# Patient Record
Sex: Male | Born: 1985 | Race: White | Hispanic: Yes | Marital: Single | Smoking: Current every day smoker
Health system: Southern US, Community
[De-identification: ages and names within clinical notes are randomized; demographics above are authoritative.]

## PROBLEM LIST (undated history)

## (undated) DIAGNOSIS — F419 Anxiety disorder, unspecified: Secondary | ICD-10-CM

---

## 2019-07-30 ENCOUNTER — Emergency Department: Payer: Self-pay

## 2019-07-30 ENCOUNTER — Other Ambulatory Visit: Payer: Self-pay

## 2019-07-30 ENCOUNTER — Emergency Department
Admission: EM | Admit: 2019-07-30 | Discharge: 2019-07-30 | Disposition: A | Discharge status: Home / Self Care | Payer: Self-pay | Attending: Emergency Medicine | Admitting: Emergency Medicine

## 2019-07-30 DIAGNOSIS — R0789 Other chest pain: Secondary | ICD-10-CM | POA: Insufficient documentation

## 2019-07-30 DIAGNOSIS — L03314 Cellulitis of groin: Secondary | ICD-10-CM

## 2019-07-30 DIAGNOSIS — L02214 Cutaneous abscess of groin: Secondary | ICD-10-CM | POA: Insufficient documentation

## 2019-07-30 LAB — COMPREHENSIVE METABOLIC PANEL
ALT: 57 U/L — ABNORMAL HIGH (ref 0–44)
AST: 27 U/L (ref 15–41)
Albumin: 4.3 g/dL (ref 3.5–5.0)
Alkaline Phosphatase: 55 U/L (ref 38–126)
Anion gap: 9 (ref 5–15)
BUN: 10 mg/dL (ref 6–20)
CO2: 24 mmol/L (ref 22–32)
Calcium: 9.2 mg/dL (ref 8.9–10.3)
Chloride: 105 mmol/L (ref 98–111)
Creatinine, Ser: 0.74 mg/dL (ref 0.61–1.24)
GFR calc Af Amer: >60 mL/min (ref >60)
GFR calc non Af Amer: >60 mL/min (ref >60)
Glucose, Bld: 109 mg/dL — ABNORMAL HIGH (ref 70–99)
Potassium: 4.8 mmol/L (ref 3.5–5.1)
Sodium: 138 mmol/L (ref 135–145)
Total Bilirubin: 1.3 mg/dL — ABNORMAL HIGH (ref 0.3–1.2)
Total Protein: 7.5 g/dL (ref 6.5–8.1)

## 2019-07-30 LAB — URINALYSIS, COMPLETE (UACMP) WITH MICROSCOPIC
Bacteria, UA: NONE SEEN
Bilirubin Urine: NEGATIVE
Glucose, UA: NEGATIVE mg/dL
Hgb urine dipstick: NEGATIVE
Ketones, ur: NEGATIVE mg/dL
Leukocytes,Ua: NEGATIVE
Nitrite: NEGATIVE
Protein, ur: NEGATIVE mg/dL
Specific Gravity, Urine: 1.005 (ref 1.005–1.030)
Squamous Epithelial / HPF: NONE SEEN (ref 0–5)
pH: 7 (ref 5.0–8.0)

## 2019-07-30 LAB — LIPASE, BLOOD: Lipase: 19 U/L (ref 11–51)

## 2019-07-30 LAB — CBC
HCT: 44.9 % (ref 39.0–52.0)
Hemoglobin: 15.5 g/dL (ref 13.0–17.0)
MCH: 31.1 pg (ref 26.0–34.0)
MCHC: 34.5 g/dL (ref 30.0–36.0)
MCV: 90.2 fL (ref 80.0–100.0)
Platelets: 282 10*3/uL (ref 150–400)
RBC: 4.98 MIL/uL (ref 4.22–5.81)
RDW: 12 % (ref 11.5–15.5)
WBC: 7 10*3/uL (ref 4.0–10.5)
nRBC: 0 % (ref 0.0–0.2)

## 2019-07-30 LAB — TROPONIN I (HIGH SENSITIVITY)
Troponin I (High Sensitivity): <2 ng/L (ref <18)
Troponin I (High Sensitivity): <2 ng/L (ref <18)

## 2019-07-30 MED ORDER — DICYCLOMINE HCL 10 MG PO CAPS: 10.0000 mg | ORAL_CAPSULE | Freq: Three times a day (TID) | ORAL | 0 refills | Status: AC | PRN

## 2019-07-30 MED ORDER — FAMOTIDINE 20 MG PO TABS
20.0000 mg | ORAL_TABLET | Freq: Two times a day (BID) | ORAL | 0 refills | Status: AC
Start: 2019-07-30 — End: 2019-08-14

## 2019-07-30 MED ORDER — MUPIROCIN CALCIUM 2 % EX CREA: TOPICAL_CREAM | CUTANEOUS | 0 refills | Status: AC

## 2019-07-30 MED ORDER — CEPHALEXIN 500 MG PO CAPS
500.0000 mg | ORAL_CAPSULE | Freq: Four times a day (QID) | ORAL | 0 refills | Status: AC
Start: 2019-07-30 — End: 2019-08-06

## 2019-07-30 NOTE — ED Notes (Signed)
Patient reports abd pain that began 1 week ago same thing happened last year. Presently c/o of and pain that radiates into jaw

## 2019-07-30 NOTE — ED Triage Notes (Signed)
Reports chest pain and abdominal pain, cramping and spasms. States he also had a panic attack at the same time, but those sx have subsided. Sx started today at 1215PM at rest.

## 2019-07-30 NOTE — ED Provider Notes (Signed)
[facility] Emergency Department Provider Note ____________________________________________   First MD Initiated Contact with Patient 07/30/19 1511     (approximate)  I have reviewed the triage vital signs and the nursing notes.   HISTORY  Chief Complaint Abdominal Pain and Chest Pain    HPI Danny Kramer is a 34 y.o. male with no active medical problems who presents with multiple complaints.  Specifically, he had an episode of crampy pain in his chest and upper abdomen, radiating up to the neck as well as to the left flank.  The pain lasted about an hour, and was accompanied by anxiety.  The patient states he had an anxiety attack, but the pain started before this.  He has had episodes of pain like this before, especially in the upper abdomen, but this 1 was more intense.  The patient reports a history of tonsil stones for several years, and states that he has intermittent cough sometimes with brownish sputum more small amount of blood especially when one of the stones passes and his tonsils are irritated.  He also reports pain in an area of the left groin where he has had a persistent skin lesion or possible abscess for at least a year.  History reviewed. No pertinent past medical history.  There are no problems to display for this patient.   History reviewed. No pertinent surgical history.  Prior to Admission medications   Medication Sig Start Date End Date Taking? Authorizing Provider  cephALEXin (KEFLEX) 500 MG capsule Take 1 capsule (500 mg total) by mouth 4 (four) times daily for 7 days. 07/30/19 08/06/19  Dionne Bucy, MD  dicyclomine (BENTYL) 10 MG capsule Take 1 capsule (10 mg total) by mouth 3 (three) times daily as needed for up to 5 days (abdominal cramping). 07/30/19 08/04/19  Dionne Bucy, MD  famotidine (PEPCID) 20 MG tablet Take 1 tablet (20 mg total) by mouth 2 (two) times daily for 15 days. 07/30/19 08/14/19  Dionne Bucy, MD   mupirocin cream Idelle Jo) 2 % Apply to affected area 3 times daily 07/30/19 07/29/20  Dionne Bucy, MD    Allergies Patient has no known allergies.  No family history on file.  Social History Social History   Tobacco Use  . Smoking status: Not on file  Substance Use Topics  . Alcohol use: Not on file  . Drug use: Not on file    Review of Systems  Constitutional: No fever. Eyes: No redness. ENT: Positive for tonsil stones. Cardiovascular: Positive for resolved chest pain. Respiratory: Positive for resolved shortness of breath. Gastrointestinal: No vomiting. Genitourinary: Negative for dysuria.  Musculoskeletal: Negative for back pain. Skin: Negative for rash. Neurological: Negative for headache.   ____________________________________________   PHYSICAL EXAM:  VITAL SIGNS: ED Triage Vitals [07/30/19 1427]  Enc Vitals Group     BP (!) 127/95     Pulse Rate 81     Resp 18     Temp 98.2 F (36.8 C)     Temp Source Oral     SpO2 100 %     Weight (!) 332 lb (150.6 kg)     Height 5\' 10"  (1.778 m)     Head Circumference      Peak Flow      Pain Score 2     Pain Loc      Pain Edu?      Excl. in GC?     Constitutional: Alert and oriented. Well appearing and in no acute  distress. Eyes: Conjunctivae are normal.  Head: Atraumatic. Nose: No congestion/rhinnorhea. Mouth/Throat: Mucous membranes are moist.  Oropharynx clear, with mild tonsillar erythema but no exudates. Neck: Normal range of motion.  Cardiovascular: Normal rate, regular rhythm. Grossly normal heart sounds.  Good peripheral circulation. Respiratory: Normal respiratory effort.  No retractions. Lungs CTAB. Gastrointestinal: Soft with mild epigastric tenderness.  No distention.  Genitourinary: No flank tenderness. Musculoskeletal: No lower extremity edema.  Extremities warm and well perfused.  Neurologic:  Normal speech and language. No gross focal neurologic deficits are appreciated.  Skin:   Skin is warm and dry. No rash noted. Psychiatric: Somewhat anxious appearing.  Mood and affect are normal. Speech and behavior are normal.  ____________________________________________   LABS (all labs ordered are listed, but only abnormal results are displayed)  Labs Reviewed  COMPREHENSIVE METABOLIC PANEL - Abnormal; Notable for the following components:      Result Value   Glucose, Bld 109 (*)    ALT 57 (*)    Total Bilirubin 1.3 (*)    All other components within normal limits  URINALYSIS, COMPLETE (UACMP) WITH MICROSCOPIC - Abnormal; Notable for the following components:   Color, Urine STRAW (*)    APPearance CLEAR (*)    All other components within normal limits  LIPASE, BLOOD  CBC  TROPONIN I (HIGH SENSITIVITY)  TROPONIN I (HIGH SENSITIVITY)   ____________________________________________  EKG  ED ECG REPORT I, Arta Silence, the attending physician, personally viewed and interpreted this ECG.  Date: 07/30/2019 EKG Time: 1412 Rate: 81 Rhythm: normal sinus rhythm QRS Axis: normal Intervals: normal ST/T Wave abnormalities: normal Narrative Interpretation: no evidence of acute ischemia  ____________________________________________  RADIOLOGY  CXR: No focal infiltrate or other acute abnormality  ____________________________________________   PROCEDURES  Procedure(s) performed: No  Procedures  Critical Care performed: No ____________________________________________   INITIAL IMPRESSION / ASSESSMENT AND PLAN / ED COURSE  Pertinent labs & imaging results that were available during my care of the patient were reviewed by me and considered in my medical decision making (see chart for details).  34 year old male with no active medical problems presents with an episode of chest and abdominal pain which has now resolved, as well as several other chronic complaints.  1.  Chest/abdominal pain: The patient describes acute onset of crampy pain which then  precipitated an anxiety attack.  This has resolved spontaneously.  The patient has no ACS risk factors other than obesity, no prior cardiac history.  EKG is nonischemic.  He does report some persistent epigastric pain especially after eating.  Exam overall is reassuring.  There is no evidence of ACS or other cardiac or vascular etiology.  Given the resolved pain there is no evidence of PE.  Overall I suspect most likely gastritis/GERD versus musculoskeletal etiology.  We will obtain cardiac enzymes, chest x-ray, and reassess.  I will likely start the patient on an H2 blocker.  2.  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  Currently the tonsils demonstrate mild erythema but no evidence of acute pharyngitis or tonsillitis.  The patient is planning on following up with ENT.  3.  Left groin lesion: The patient has some chronic appearing induration to the area of the left groin underneath his abdominal pannus.  The patient reports that it intermittently drains pus.  It does appear somewhat inflamed, but with no significant fluctuance.  There is no indication for I&D but I will give the patient  a topical and oral antibiotic.  ----------------------------------------- 5:34 PM on 07/30/2019 -----------------------------------------  Initial and review troponin are both negative.  The patient is stable for discharge home at this time.  I counseled him on the results of the work-up.  I have prescribed Pepcid, Bentyl for abdominal cramping, as well as Keflex and Bactroban for the left groin lesion.  Return precautions given, the patient expresses understanding. ____________________________________________   FINAL CLINICAL IMPRESSION(S) / ED DIAGNOSES  Final diagnoses:  Atypical chest pain  Cellulitis of groin      NEW MEDICATIONS STARTED DURING THIS VISIT:  New Prescriptions   CEPHALEXIN (KEFLEX) 500  MG CAPSULE    Take 1 capsule (500 mg total) by mouth 4 (four) times daily for 7 days.   DICYCLOMINE (BENTYL) 10 MG CAPSULE    Take 1 capsule (10 mg total) by mouth 3 (three) times daily as needed for up to 5 days (abdominal cramping).   FAMOTIDINE (PEPCID) 20 MG TABLET    Take 1 tablet (20 mg total) by mouth 2 (two) times daily for 15 days.   MUPIROCIN CREAM (BACTROBAN) 2 %    Apply to affected area 3 times daily     Note:  This document was prepared using Dragon voice recognition software and may include unintentional dictation errors.    Dionne Bucy, MD 07/30/19 1734

## 2019-07-30 NOTE — Discharge Instructions (Addendum)
Take the Keflex (cephalexin) as prescribed for the next 7 days and finish the full course.  You can also apply the Bactroban cream to the affected area in the left groin.  Take the Pepcid twice daily for the next 2 weeks to help with the stomach pain, and you may take the Bentyl as needed for crampy abdominal pain.  Return here or to the nearest ER for new, worsening, or persistent severe abdominal or chest pain, vomiting, weakness, or any other new or worsening symptoms that concern you.

## 2019-07-30 NOTE — ED Notes (Signed)
Patient evaluated by md awaiting further plan of care and or disposition.

## 2020-10-13 ENCOUNTER — Encounter (HOSPITAL_BASED_OUTPATIENT_CLINIC_OR_DEPARTMENT_OTHER): Payer: Self-pay

## 2020-10-13 ENCOUNTER — Emergency Department (HOSPITAL_BASED_OUTPATIENT_CLINIC_OR_DEPARTMENT_OTHER)
Admission: EM | Admit: 2020-10-13 | Discharge: 2020-10-13 | Disposition: A | Discharge status: Home / Self Care | Payer: BLUE CROSS/BLUE SHIELD | Source: Home / Self Care | Attending: Emergency Medicine | Admitting: Emergency Medicine

## 2020-10-13 ENCOUNTER — Emergency Department (HOSPITAL_BASED_OUTPATIENT_CLINIC_OR_DEPARTMENT_OTHER)
Admission: EM | Admit: 2020-10-13 | Discharge: 2020-10-13 | Disposition: A | Discharge status: Home / Self Care | Payer: BLUE CROSS/BLUE SHIELD | Attending: Emergency Medicine | Admitting: Emergency Medicine

## 2020-10-13 ENCOUNTER — Encounter (HOSPITAL_BASED_OUTPATIENT_CLINIC_OR_DEPARTMENT_OTHER): Payer: Self-pay | Admitting: Emergency Medicine

## 2020-10-13 ENCOUNTER — Emergency Department (HOSPITAL_BASED_OUTPATIENT_CLINIC_OR_DEPARTMENT_OTHER): Payer: BLUE CROSS/BLUE SHIELD | Admitting: Radiology

## 2020-10-13 ENCOUNTER — Other Ambulatory Visit: Payer: Self-pay

## 2020-10-13 DIAGNOSIS — F17210 Nicotine dependence, cigarettes, uncomplicated: Secondary | ICD-10-CM | POA: Insufficient documentation

## 2020-10-13 DIAGNOSIS — R109 Unspecified abdominal pain: Secondary | ICD-10-CM | POA: Diagnosis not present

## 2020-10-13 DIAGNOSIS — R0789 Other chest pain: Secondary | ICD-10-CM

## 2020-10-13 DIAGNOSIS — R0781 Pleurodynia: Secondary | ICD-10-CM | POA: Insufficient documentation

## 2020-10-13 DIAGNOSIS — Z79899 Other long term (current) drug therapy: Secondary | ICD-10-CM | POA: Insufficient documentation

## 2020-10-13 DIAGNOSIS — F419 Anxiety disorder, unspecified: Secondary | ICD-10-CM | POA: Insufficient documentation

## 2020-10-13 HISTORY — DX: Anxiety disorder, unspecified: F41.9

## 2020-10-13 LAB — CBC
HCT: 49.3 % (ref 39.0–52.0)
Hemoglobin: 16.8 g/dL (ref 13.0–17.0)
MCH: 31 pg (ref 26.0–34.0)
MCHC: 34.1 g/dL (ref 30.0–36.0)
MCV: 91 fL (ref 80.0–100.0)
Platelets: 326 10*3/uL (ref 150–400)
RBC: 5.42 MIL/uL (ref 4.22–5.81)
RDW: 13 % (ref 11.5–15.5)
WBC: 9.8 10*3/uL (ref 4.0–10.5)
nRBC: 0 % (ref 0.0–0.2)

## 2020-10-13 LAB — RAPID URINE DRUG SCREEN, HOSP PERFORMED
Amphetamines: NOT DETECTED
Barbiturates: NOT DETECTED
Benzodiazepines: NOT DETECTED
Cocaine: NOT DETECTED
Opiates: NOT DETECTED
Tetrahydrocannabinol: POSITIVE — AB

## 2020-10-13 LAB — COMPREHENSIVE METABOLIC PANEL
ALT: 54 U/L — ABNORMAL HIGH (ref 0–44)
AST: 22 U/L (ref 15–41)
Albumin: 4.6 g/dL (ref 3.5–5.0)
Alkaline Phosphatase: 61 U/L (ref 38–126)
Anion gap: 13 (ref 5–15)
BUN: 9 mg/dL (ref 6–20)
CO2: 21 mmol/L — ABNORMAL LOW (ref 22–32)
Calcium: 9.4 mg/dL (ref 8.9–10.3)
Chloride: 105 mmol/L (ref 98–111)
Creatinine, Ser: 0.67 mg/dL (ref 0.61–1.24)
GFR, Estimated: >60 mL/min (ref >60)
Glucose, Bld: 113 mg/dL — ABNORMAL HIGH (ref 70–99)
Potassium: 3.6 mmol/L (ref 3.5–5.1)
Sodium: 139 mmol/L (ref 135–145)
Total Bilirubin: 1.7 mg/dL — ABNORMAL HIGH (ref 0.3–1.2)
Total Protein: 7.5 g/dL (ref 6.5–8.1)

## 2020-10-13 MED ORDER — LORAZEPAM 2 MG/ML IJ SOLN
1.0000 mg | Freq: Once | INTRAMUSCULAR | Status: AC
  Administered 2020-10-13: 1 mg via INTRAVENOUS
  Filled 2020-10-13: qty 1

## 2020-10-13 MED ORDER — ALUM & MAG HYDROXIDE-SIMETH 200-200-20 MG/5ML PO SUSP
30.0000 mL | Freq: Once | ORAL | Status: AC
  Administered 2020-10-13: 30 mL via ORAL
  Filled 2020-10-13: qty 30

## 2020-10-13 MED ORDER — LORAZEPAM 1 MG PO TABS: 1.0000 mg | ORAL_TABLET | Freq: Three times a day (TID) | ORAL | 0 refills | Status: AC | PRN

## 2020-10-13 NOTE — ED Provider Notes (Signed)
[facility] EMERGENCY DEPT Provider Note   CSN: [id number] Arrival date & time: 10/13/20  1048     History Chief Complaint  Patient presents with   Anxiety    Danny Kramer is a 35 y.o. male.  Pt presents to the ED today with severe anxiety.  Pt has had issues with anxiety, but today it is much worse.  Pt said he took Lexapro for the first time yesterday.  He feels like that may be the cause.  He did come earlier this am for atypical cp.  He has had that a lot.  Pt said he called his psychiatrist who wanted him to be checked for serotonin syndrome.       Past Medical History:  Diagnosis Date   Anxiety     There are no problems to display for this patient.   History reviewed. No pertinent surgical history.     No family history on file.  Social History   Tobacco Use   Smoking status: Every Day    Packs/day: 0.50    Types: Cigarettes   Smokeless tobacco: Never  Substance Use Topics   Alcohol use: Never   Drug use: Not Currently    Home Medications Prior to Admission medications   Medication Sig Start Date End Date Taking? Authorizing Provider  LORazepam (ATIVAN) 1 MG tablet Take 1 tablet (1 mg total) by mouth 3 (three) times daily as needed for anxiety. 10/13/20  Yes Jacalyn Lefevre, MD  dicyclomine (BENTYL) 10 MG capsule Take 1 capsule (10 mg total) by mouth 3 (three) times daily as needed for up to 5 days (abdominal cramping). 07/30/19 08/04/19  Dionne Bucy, MD  famotidine (PEPCID) 20 MG tablet Take 1 tablet (20 mg total) by mouth 2 (two) times daily for 15 days. 07/30/19 08/14/19  Dionne Bucy, MD    Allergies    Patient has no known allergies.  Review of Systems   Review of Systems  Psychiatric/Behavioral:  The patient is nervous/anxious.   All other systems reviewed and are negative.  Physical Exam Updated Vital Signs BP 119/83 (BP Location: Left Arm)   Pulse 66   Temp 98.7 F (37.1 C) (Oral)   Resp (!) 22   Ht 5\' 9"   (1.753 m)   Wt 128.4 kg   SpO2 100%   BMI 41.79 kg/m   Physical Exam Vitals and nursing note reviewed.  Constitutional:      Appearance: Normal appearance. He is obese.  HENT:     Head: Normocephalic and atraumatic.     Right Ear: External ear normal.     Left Ear: External ear normal.     Nose: Nose normal.     Mouth/Throat:     Mouth: Mucous membranes are moist.     Pharynx: Oropharynx is clear.  Eyes:     Extraocular Movements: Extraocular movements intact.     Conjunctiva/sclera: Conjunctivae normal.     Pupils: Pupils are equal, round, and reactive to light.  Cardiovascular:     Rate and Rhythm: Normal rate and regular rhythm.     Pulses: Normal pulses.     Heart sounds: Normal heart sounds.  Pulmonary:     Effort: Pulmonary effort is normal.     Breath sounds: Normal breath sounds.  Abdominal:     General: Abdomen is flat. Bowel sounds are normal.     Palpations: Abdomen is soft.  Musculoskeletal:        General: Normal range of motion.  Cervical back: Normal range of motion and neck supple.  Skin:    General: Skin is warm and dry.     Capillary Refill: Capillary refill takes less than 2 seconds.  Neurological:     General: No focal deficit present.     Mental Status: He is alert and oriented to person, place, and time.  Psychiatric:        Mood and Affect: Mood is anxious.        Behavior: Behavior normal.        Thought Content: Thought content normal.        Judgment: Judgment normal.    ED Results / Procedures / Treatments   Labs (all labs ordered are listed, but only abnormal results are displayed) Labs Reviewed  COMPREHENSIVE METABOLIC PANEL - Abnormal; Notable for the following components:      Result Value   CO2 21 (*)    Glucose, Bld 113 (*)    ALT 54 (*)    Total Bilirubin 1.7 (*)    All other components within normal limits  RAPID URINE DRUG SCREEN, HOSP PERFORMED - Abnormal; Notable for the following components:   Tetrahydrocannabinol  POSITIVE (*)    All other components within normal limits  CBC  ETHANOL  SALICYLATE LEVEL  ACETAMINOPHEN LEVEL    EKG None  Radiology DG Ribs Bilateral W/Chest  Result Date: 10/13/2020 CLINICAL DATA:  35 year old male with bilateral rib pain. No known injury. EXAM: BILATERAL RIBS AND CHEST - 4+ VIEW COMPARISON:  Chest radiograph dated 07/30/2019. FINDINGS: No fracture or other bone lesions are seen involving the ribs. There is no evidence of pneumothorax or pleural effusion. Both lungs are clear. Heart size and mediastinal contours are within normal limits. IMPRESSION: Negative. Electronically Signed   By: Elgie Collard M.D.   On: 10/13/2020 03:33    Procedures Procedures   Medications Ordered in ED Medications  LORazepam (ATIVAN) injection 1 mg (1 mg Intravenous Given 10/13/20 1413)  LORazepam (ATIVAN) injection 1 mg (1 mg Intravenous Given 10/13/20 1450)  alum & mag hydroxide-simeth (MAALOX/MYLANTA) 200-200-20 MG/5ML suspension 30 mL (30 mLs Oral Given 10/13/20 1508)    ED Course  I have reviewed the triage vital signs and the nursing notes.  Pertinent labs & imaging results that were available during my care of the patient were reviewed by me and considered in my medical decision making (see chart for details).    MDM Rules/Calculators/A&P                          Pt is feeling much better.  He is stable for d/c.  He knows to f/u with his psychiatrist.  He is given a short course of ativan to help him through this acute phase.  Return if worse.  Final Clinical Impression(s) / ED Diagnoses Final diagnoses:  Anxiety    Rx / DC Orders ED Discharge Orders          Ordered    LORazepam (ATIVAN) 1 MG tablet  3 times daily PRN        10/13/20 1513             Jacalyn Lefevre, MD 10/13/20 1514

## 2020-10-13 NOTE — ED Provider Notes (Signed)
[facility] EMERGENCY DEPT Provider Note   CSN: [id number] Arrival date & time: 10/13/20  0229     History Chief Complaint  Patient presents with   Flank Pain    Left    Danny Kramer is a 35 y.o. male.   Flank Pain Associated symptoms include chest pain. Pertinent negatives include no shortness of breath. Patient presents with bilateral lower chest pain.  Comes and goes.  Has had for around a year now.  Has had ultrasound of his right upper quadrant was reportedly negative.  States has been told it was costochondritis in the past.  States he gets anxious about it.  Did have 1 episode of vomiting.  No fevers or chills.  Has had some weight loss but has been trying to lose weight.  No cough.  No hemoptysis.  No diarrhea or constipation.  States he is change his diet to include a lot of fiber.  States no change with eating or no change with bowel movements.  States sometimes the pain will be on the right sometimes appear to be on the left.     History reviewed. No pertinent past medical history.  There are no problems to display for this patient.   History reviewed. No pertinent surgical history.     No family history on file.  Social History   Tobacco Use   Smoking status: Every Day    Packs/day: 0.50    Types: Cigarettes   Smokeless tobacco: Never  Substance Use Topics   Alcohol use: Never   Drug use: Not Currently    Home Medications Prior to Admission medications   Medication Sig Start Date End Date Taking? Authorizing Provider  dicyclomine (BENTYL) 10 MG capsule Take 1 capsule (10 mg total) by mouth 3 (three) times daily as needed for up to 5 days (abdominal cramping). 07/30/19 08/04/19  Dionne Bucy, MD  famotidine (PEPCID) 20 MG tablet Take 1 tablet (20 mg total) by mouth 2 (two) times daily for 15 days. 07/30/19 08/14/19  Dionne Bucy, MD    Allergies    Patient has no known allergies.  Review of Systems   Review of Systems   Constitutional:  Negative for appetite change.  HENT:  Negative for congestion.   Respiratory:  Negative for shortness of breath.   Cardiovascular:  Positive for chest pain.  Gastrointestinal:  Negative for constipation, nausea and vomiting.  Genitourinary:  Positive for flank pain.  Musculoskeletal:  Negative for back pain.  Skin:  Negative for rash.  Neurological:  Negative for weakness.  Psychiatric/Behavioral:  The patient is nervous/anxious.    Physical Exam Updated Vital Signs BP 131/78   Pulse 71   Temp 98.6 F (37 C) (Oral)   Resp (!) 7   Ht 5\' 9"  (1.753 m)   Wt 131.2 kg   SpO2 99%   BMI 42.72 kg/m   Physical Exam Vitals and nursing note reviewed.  Constitutional:      Appearance: He is obese.  HENT:     Head: Atraumatic.  Eyes:     Pupils: Pupils are equal, round, and reactive to light.  Cardiovascular:     Rate and Rhythm: Regular rhythm.  Pulmonary:     Breath sounds: No wheezing or rhonchi.     Comments: Tenderness over bilateral lower lateral chest wall.  No crepitus deformity.  No rash. Chest:     Chest wall: Tenderness present.  Abdominal:     Tenderness: There is no abdominal tenderness.  Musculoskeletal:        General: No tenderness.     Cervical back: Neck supple.  Skin:    General: Skin is warm.     Capillary Refill: Capillary refill takes less than 2 seconds.  Neurological:     Mental Status: He is alert and oriented to person, place, and time.  Psychiatric:        Mood and Affect: Mood normal.    ED Results / Procedures / Treatments   Labs (all labs ordered are listed, but only abnormal results are displayed) Labs Reviewed - No data to display  EKG None  Radiology DG Ribs Bilateral W/Chest  Result Date: 10/13/2020 CLINICAL DATA:  35 year old male with bilateral rib pain. No known injury. EXAM: BILATERAL RIBS AND CHEST - 4+ VIEW COMPARISON:  Chest radiograph dated 07/30/2019. FINDINGS: No fracture or other bone lesions are seen  involving the ribs. There is no evidence of pneumothorax or pleural effusion. Both lungs are clear. Heart size and mediastinal contours are within normal limits. IMPRESSION: Negative. Electronically Signed   By: Elgie Collard M.D.   On: 10/13/2020 03:33    Procedures Procedures   Medications Ordered in ED Medications - No data to display  ED Course  I have reviewed the triage vital signs and the nursing notes.  Pertinent labs & imaging results that were available during my care of the patient were reviewed by me and considered in my medical decision making (see chart for details).    MDM Rules/Calculators/A&P                          Patient presents with bilateral chest pain.  Comes and goes.  Is been going for a while now.  Reproducible.  Chest x-ray done and reassuring.  No abdominal tenderness.  Not feels the need abdominal imaging.  Has follow-up with PCP.  Doubt severe acute cause of the pain.  Discharge home. Final Clinical Impression(s) / ED Diagnoses Final diagnoses:  Rib pain  Chest wall pain    Rx / DC Orders ED Discharge Orders     None        Benjiman Core, MD 10/13/20 [phone]

## 2020-10-13 NOTE — ED Triage Notes (Signed)
Patient states here POV from Home with Flank Pain (Left).  Patient states this pain began over 1 year ago but began again this February. Pain has been increasing in Intensity since and patient decided to come today because of one episode of Emesis. Pain is Intermittent and Wave-Like.   Ambulatory, GCS 15. Patient Anxious during Triage.

## 2020-10-13 NOTE — ED Triage Notes (Addendum)
Pt c/o severe anxiety, body cramping, shaking since last night. He started taking lexapro last night. These symptoms started 3 hours after taking. Was seen last night for same symptoms. His psychiatrist told him to return for blood work.

## 2022-06-17 ENCOUNTER — Other Ambulatory Visit: Payer: Self-pay

## 2022-06-17 ENCOUNTER — Emergency Department
Admission: EM | Admit: 2022-06-17 | Discharge: 2022-06-17 | Disposition: A | Discharge status: Home / Self Care | Payer: Medicaid Other | Attending: Emergency Medicine | Admitting: Emergency Medicine

## 2022-06-17 DIAGNOSIS — J101 Influenza due to other identified influenza virus with other respiratory manifestations: Secondary | ICD-10-CM | POA: Insufficient documentation

## 2022-06-17 DIAGNOSIS — R55 Syncope and collapse: Secondary | ICD-10-CM | POA: Diagnosis present

## 2022-06-17 DIAGNOSIS — Z1152 Encounter for screening for COVID-19: Secondary | ICD-10-CM | POA: Insufficient documentation

## 2022-06-17 LAB — CBC
HCT: 53.1 % — ABNORMAL HIGH (ref 39.0–52.0)
Hemoglobin: 17.5 g/dL — ABNORMAL HIGH (ref 13.0–17.0)
MCH: 31.2 pg (ref 26.0–34.0)
MCHC: 33 g/dL (ref 30.0–36.0)
MCV: 94.7 fL (ref 80.0–100.0)
Platelets: 207 10*3/uL (ref 150–400)
RBC: 5.61 MIL/uL (ref 4.22–5.81)
RDW: 12.8 % (ref 11.5–15.5)
WBC: 5.3 10*3/uL (ref 4.0–10.5)
nRBC: 0 % (ref 0.0–0.2)

## 2022-06-17 LAB — BASIC METABOLIC PANEL
Anion gap: 10 (ref 5–15)
BUN: 13 mg/dL (ref 6–20)
CO2: 25 mmol/L (ref 22–32)
Calcium: 8.8 mg/dL — ABNORMAL LOW (ref 8.9–10.3)
Chloride: 103 mmol/L (ref 98–111)
Creatinine, Ser: 0.96 mg/dL (ref 0.61–1.24)
GFR, Estimated: >60 mL/min (ref >60)
Glucose, Bld: 107 mg/dL — ABNORMAL HIGH (ref 70–99)
Potassium: 4.1 mmol/L (ref 3.5–5.1)
Sodium: 138 mmol/L (ref 135–145)

## 2022-06-17 LAB — RESP PANEL BY RT-PCR (RSV, FLU A&B, COVID)  RVPGX2
Influenza A by PCR: NEGATIVE
Influenza B by PCR: POSITIVE — AB
Resp Syncytial Virus by PCR: NEGATIVE
SARS Coronavirus 2 by RT PCR: NEGATIVE

## 2022-06-17 NOTE — ED Triage Notes (Addendum)
Pt to ED via POV from home. Pt reports he believes he has flu and has been having LOC multiple times. Pt reports he was in the shower and had syncopal episode. Pt reports right sided rib pain. Pt also reports rib pain/issues x3 years

## 2022-06-17 NOTE — Discharge Instructions (Signed)
Today your influenza test is positive.  Tylenol as needed for body aches, headache, fever.  Drink lots of fluids to stay hydrated.  You are contagious.  After you begin feeling well you should call to see about [facility] which [facility]  Dr. Vicente Males is the gastroenterologist that is on-call and his contact information and address are listed on your discharge papers.  You will need to call make an appointment for follow-up.

## 2022-06-17 NOTE — ED Provider Notes (Signed)
[facility] Provider Note    Event Date/Time   First MD Initiated Contact with Patient 06/17/22 1034     (approximate)   History   Fall   HPI  Danny Kramer is a 37 y.o. male   presents to the ED with complaint of feeling as if he has the flu.  He is unaware of any known sick exposures.  He also reports that he has had chronic issues for the last 3 years and has been to multiple doctors and emergency departments without any answers to why he feels bad.  He states this is an ongoing issue and that his symptoms have not worsened but remain the same.  He continues to have some abdominal discomfort, weakness, nausea.  He currently denies any vomiting or diarrhea, fever or chills.  No urinary symptoms at this time.  Patient denies any head injury during his reported multiple syncopal episodes.      Physical Exam   Triage Vital Signs: ED Triage Vitals  Enc Vitals Group     BP 06/17/22 1021 (!) 142/96     Pulse Rate 06/17/22 1021 69     Resp 06/17/22 1021 18     Temp 06/17/22 1021 98 F (36.7 C)     Temp Source 06/17/22 1021 Oral     SpO2 06/17/22 1021 98 %     Weight --      Height --      Head Circumference --      Peak Flow --      Pain Score 06/17/22 1022 4     Pain Loc --      Pain Edu? --      Excl. in [place]? --     Most recent vital signs: Vitals:   06/17/22 1021  BP: (!) 142/96  Pulse: 69  Resp: 18  Temp: 98 F (36.7 C)  SpO2: 98%     General: Awake, no distress.  Patient is able to speak in complete sentences without any shortness of breath or difficulties. CV:  Good peripheral perfusion.  Heart regular rate and rhythm. Resp:  Normal effort.  Lungs are clear bilaterally. Abd:  No distention.  Soft, diffuse tenderness throughout.  Bowel sounds are present x 4 quadrants. Other:     ED Results / Procedures / Treatments   Labs (all labs ordered are listed, but only abnormal results are displayed) Labs Reviewed  RESP PANEL BY  RT-PCR (RSV, FLU A&B, COVID)  RVPGX2 - Abnormal; Notable for the following components:      Result Value   Influenza B by PCR POSITIVE (*)    All other components within normal limits  BASIC METABOLIC PANEL - Abnormal; Notable for the following components:   Glucose, Bld 107 (*)    Calcium 8.8 (*)    All other components within normal limits  CBC - Abnormal; Notable for the following components:   Hemoglobin 17.5 (*)    HCT 53.1 (*)    All other components within normal limits  URINALYSIS, ROUTINE W REFLEX MICROSCOPIC     EKG Vent. rate 68 BPM PR interval 146 ms QRS duration 88 ms QT/QTcB 390/414 ms P-R-T axes 55 97 51 Normal sinus rhythm Rightward axis Borderline ECG When compared with ECG of 30-Jul-2019 14:12, Questionable change in QRS axis Confirmed by UNCONFIRMED, DOCTOR ([id number]), editor Dwaine Deter ([id number]) on 06/17/2022 11:09:14 AM Also confirmed by Florence Canner [phone]), editor Romie Minus,     PROCEDURES:  Critical  Care performed:   Procedures   MEDICATIONS ORDERED IN ED: Medications - No data to display   IMPRESSION / MDM / [place] / ED COURSE  I reviewed the triage vital signs and the nursing notes.   Differential diagnosis includes, but is not limited to, COVID, influenza, RSV, viral illness, chronic abdominal issues x 3 years.  37 year old male presents to the ED with complaint of not feeling well along with multiple other chronic complaints for the last 3 years.  Patient was made aware that he is positive for influenza B.  CBC unremarkable with a hemoglobin of 17.5 and hematocrit 35.1 and white count unremarkable at 5.3.  BMP shows a glucose nonfasting at 107 BUN 13 and creatinine 0.96.  With his multiple chronic GI complaints we discussed follow-up with a gastroenterologist at which time patient states that he does not want to have an endoscopy or colonoscopy but wants to have a noninvasive procedure to evaluate his GI tract.  He is to follow-up  with Dr. Vicente Males who is on-call for gastroenterology.  I explained to the patient that the specialist would discuss with him what procedure needed to be done to best take care of his condition and evaluated properly.  Today he is strongly encouraged to take Tylenol and drink fluids to stay hydrated with his influenza.  He also was made aware that he is contagious.      Patient's presentation is most consistent with acute complicated illness / injury requiring diagnostic workup.  FINAL CLINICAL IMPRESSION(S) / ED DIAGNOSES   Final diagnoses:  Influenza B     Rx / DC Orders   ED Discharge Orders     None        Note:  This document was prepared using Dragon voice recognition software and may include unintentional dictation errors.   Johnn Hai, PA-C 06/17/22 1453    Lavonia Drafts, MD 06/17/22 1535

## 2022-06-20 ENCOUNTER — Telehealth: Payer: Self-pay | Admitting: Gastroenterology

## 2022-06-20 NOTE — Telephone Encounter (Signed)
Good Morning Dr Candis Schatz   Supervising MD 06/20/22  AM  We received a call from patient wanting to establish care with our practice.  Patient has previous history from [facility]. Records are available for review in epic. Please review and advise on scheduling.   Thank you

## 2022-09-24 IMAGING — DX DG RIBS W/ CHEST 3+V BILAT
7 series · 7 of 7 positions shown · non-contrast
Comparison: Chest radiograph dated 07/30/2019.

CLINICAL DATA: 34-year-old male with bilateral rib pain. No known
injury.

EXAM:
BILATERAL RIBS AND CHEST - 4+ VIEW

[chest pa]
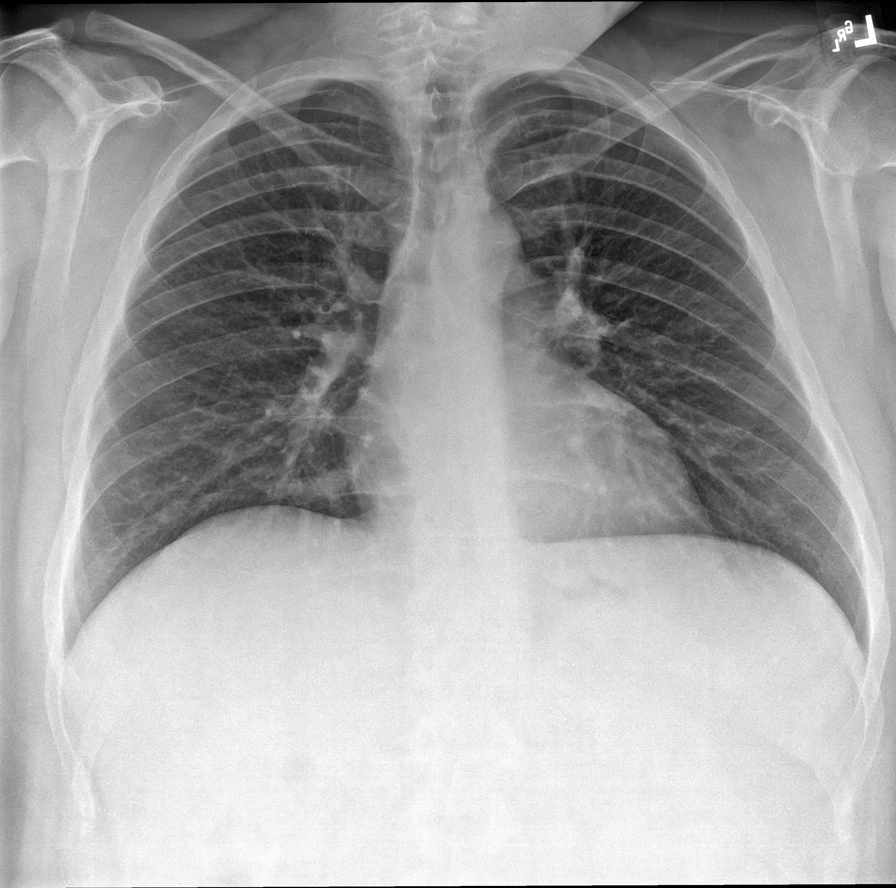

[rib ap (1 of 4)]
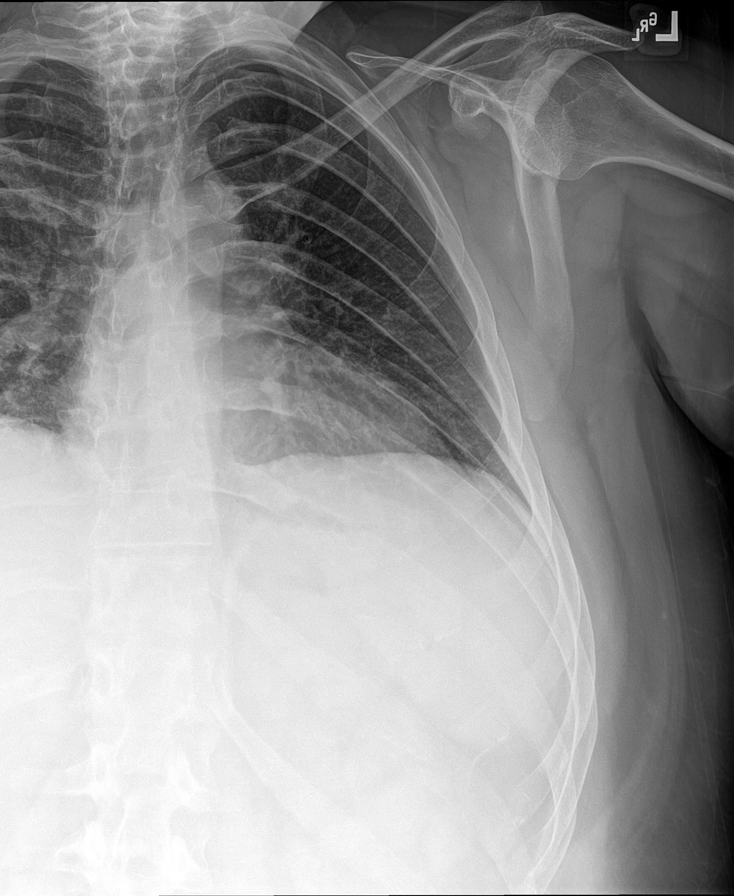

[rib ap (2 of 4)]
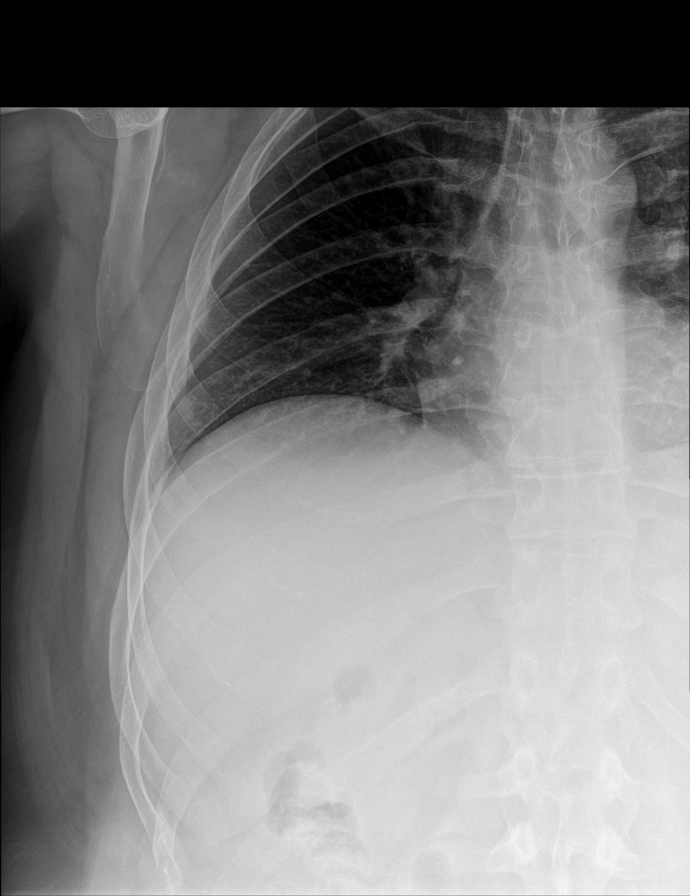

[rib ap (3 of 4)]
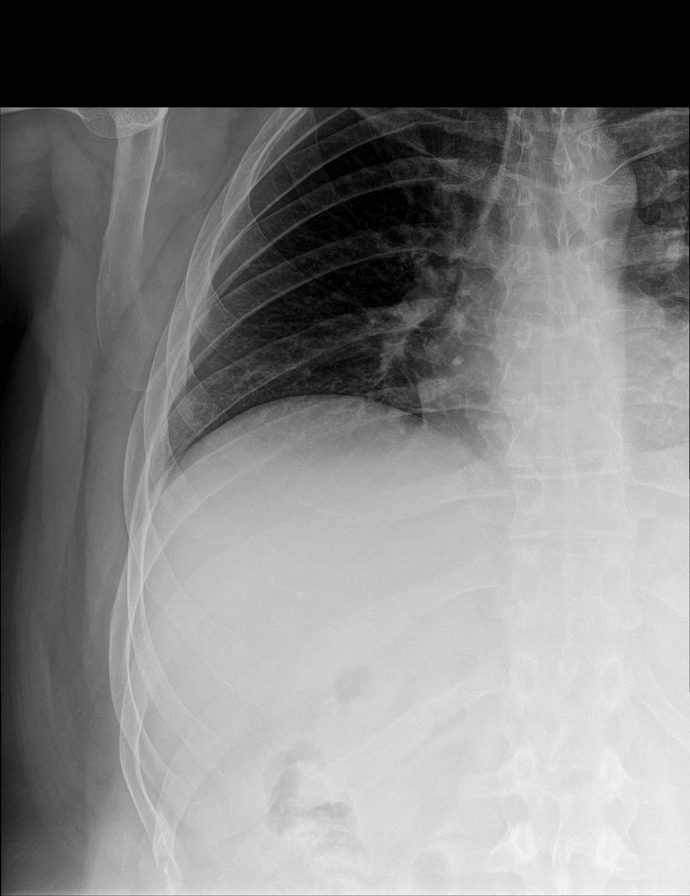

[rib obl (1 of 2)]
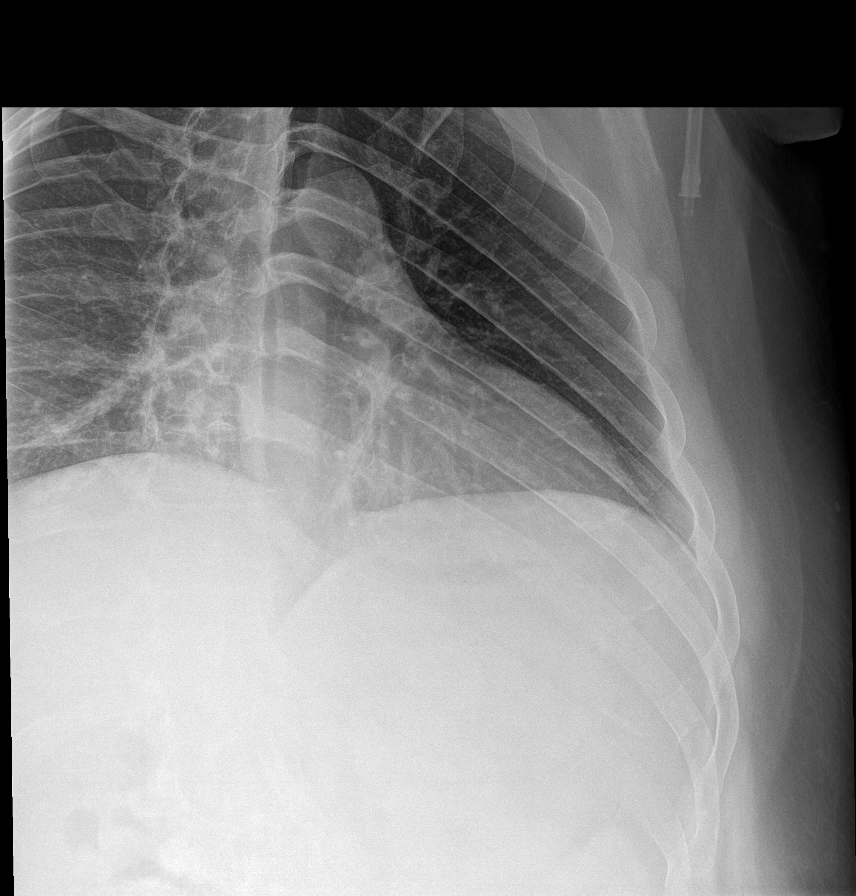

[rib obl (2 of 2)]
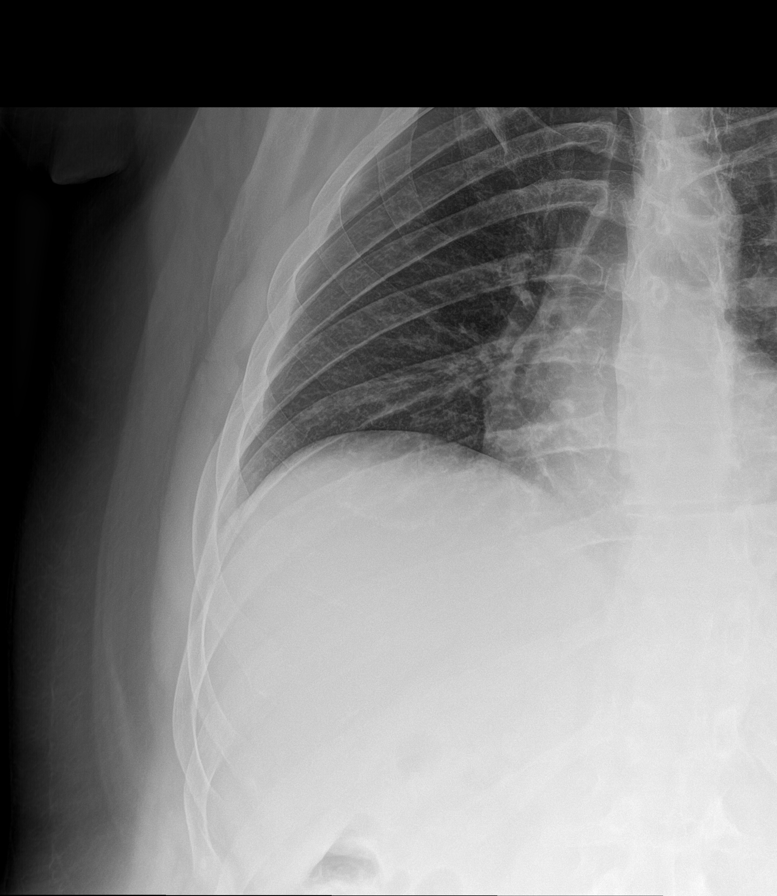

[rib ap (4 of 4)]
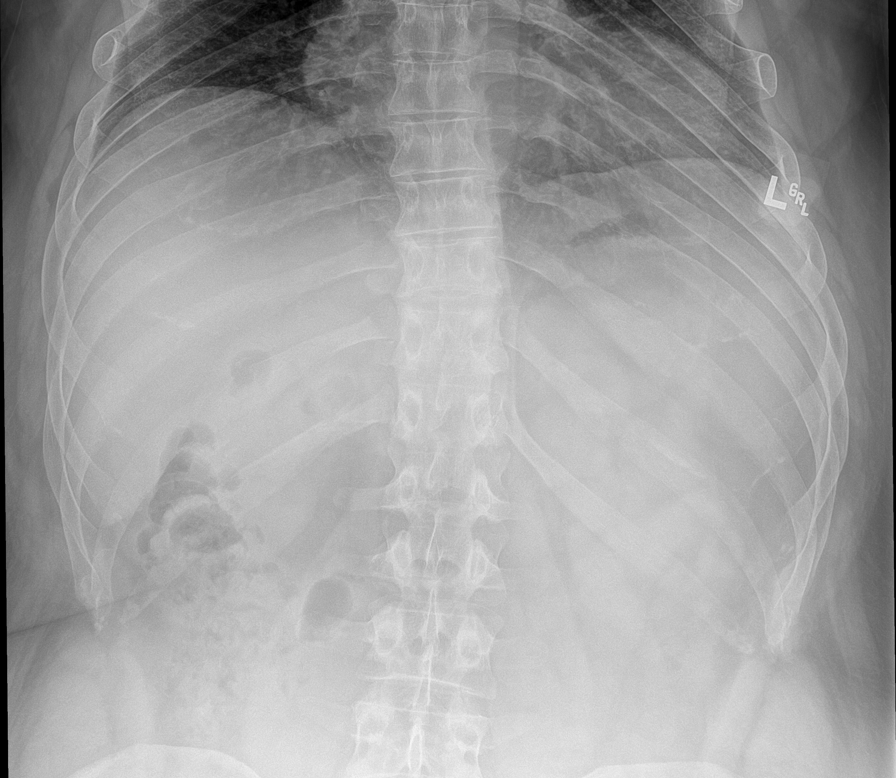

[7 of 7 positions shown; findings below may reference images not displayed]

FINDINGS: No fracture or other bone lesions are seen involving the ribs. There
is no evidence of pneumothorax or pleural effusion. Both lungs are
clear. Heart size and mediastinal contours are within normal limits.
IMPRESSION: Negative.
